# Patient Record
Sex: Male | Born: 2005 | Race: Black or African American | Hispanic: No | Marital: Single | Smoking: Never smoker
Health system: Southern US, Community
[De-identification: ages and names within clinical notes are randomized; demographics above are authoritative.]

---
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
## 2023-11-24 ENCOUNTER — Emergency Department (HOSPITAL_BASED_OUTPATIENT_CLINIC_OR_DEPARTMENT_OTHER)
Admission: EM | Admit: 2023-11-24 | Discharge: 2023-11-24 | Disposition: A | Discharge status: Home / Self Care | Payer: Medicaid Other | Attending: Emergency Medicine | Admitting: Emergency Medicine

## 2023-11-24 ENCOUNTER — Other Ambulatory Visit: Payer: Self-pay

## 2023-11-24 ENCOUNTER — Encounter (HOSPITAL_BASED_OUTPATIENT_CLINIC_OR_DEPARTMENT_OTHER): Payer: Self-pay

## 2023-11-24 DIAGNOSIS — R040 Epistaxis: Secondary | ICD-10-CM | POA: Insufficient documentation

## 2023-11-24 DIAGNOSIS — Z20822 Contact with and (suspected) exposure to covid-19: Secondary | ICD-10-CM | POA: Insufficient documentation

## 2023-11-24 DIAGNOSIS — J101 Influenza due to other identified influenza virus with other respiratory manifestations: Secondary | ICD-10-CM | POA: Diagnosis not present

## 2023-11-24 LAB — RESP PANEL BY RT-PCR (RSV, FLU A&B, COVID)  RVPGX2
Influenza A by PCR: POSITIVE — AB
Influenza B by PCR: NEGATIVE
Resp Syncytial Virus by PCR: NEGATIVE
SARS Coronavirus 2 by RT PCR: NEGATIVE

## 2023-11-24 MED ORDER — SILVER NITRATE-POT NITRATE 75-25 % EX MISC
1.0000 | Freq: Once | CUTANEOUS | Status: DC
  Filled 2023-11-24: qty 10

## 2023-11-24 MED ORDER — OXYMETAZOLINE HCL 0.05 % NA SOLN
1.0000 | Freq: Once | NASAL | Status: AC
  Administered 2023-11-24: 1 via NASAL
  Filled 2023-11-24: qty 30

## 2023-11-24 NOTE — ED Triage Notes (Signed)
Pt POV reporting nosebleed that started 30 min ago.

## 2023-11-24 NOTE — ED Provider Notes (Signed)
[place] [facility] AT [facility] Provider Note   CSN: [id number] Arrival date & time: 11/24/23  0027     History  Chief Complaint  Patient presents with   Epistaxis    Stanley Chan is a 18 y.o. male.  The history is provided by the patient.  Epistaxis Location:  L nare Severity:  Moderate Timing:  Constant Progression:  Unchanged Chronicity:  New Context: recent infection   Context: not nose picking   Relieved by:  Nothing Worsened by:  Nothing Ineffective treatments:  None tried Associated symptoms: no blood in oropharynx and no fever   Risk factors: no radiation treatment, no recent chemotherapy, no recent nasal surgery and no sinus problems        Home Medications Prior to Admission medications   Medication Sig Start Date End Date Taking? Authorizing Provider  albuterol (VENTOLIN HFA) 108 (90 Base) MCG/ACT inhaler Inhale 1-2 puffs into the lungs every 6 (six) hours as needed for wheezing or shortness of breath. 03/01/20   Lamptey, Britta Mccreedy, MD      Allergies    Penicillins    Review of Systems   Review of Systems  Constitutional:  Negative for fever.  HENT:  Positive for nosebleeds.   All other systems reviewed and are negative.   Physical Exam Updated Vital Signs BP (!) 148/76 (BP Location: Right Arm)   Pulse 83   Temp (!) 97.5 F (36.4 C)   Resp 18   Ht 5\' 7"  (1.702 m)   SpO2 98%  Physical Exam Vitals and nursing note reviewed.  Constitutional:      General: He is not in acute distress.    Appearance: Normal appearance. He is well-developed. He is not diaphoretic.  HENT:     Head: Normocephalic and atraumatic.     Nose: Nose normal.     Right Nostril: No septal hematoma.     Left Nostril: No septal hematoma.     Comments: Scabbed blood in the left anterior nare, no active bleeding  Eyes:     Conjunctiva/sclera: Conjunctivae normal.     Pupils: Pupils are equal, round, and reactive to light.  Cardiovascular:      Rate and Rhythm: Normal rate and regular rhythm.  Pulmonary:     Effort: Pulmonary effort is normal.     Breath sounds: Normal breath sounds. No wheezing or rales.  Abdominal:     General: Bowel sounds are normal.     Palpations: Abdomen is soft.     Tenderness: There is no abdominal tenderness. There is no guarding or rebound.  Musculoskeletal:        General: Normal range of motion.     Cervical back: Normal range of motion and neck supple.  Skin:    General: Skin is warm and dry.  Neurological:     Mental Status: He is alert and oriented to person, place, and time.     ED Results / Procedures / Treatments   Labs (all labs ordered are listed, but only abnormal results are displayed) Labs Reviewed - No data to display  EKG None  Radiology No results found.  Procedures Procedures    Medications Ordered in ED Medications  silver nitrate applicators applicator 1 Application (has no administration in time range)  oxymetazoline (AFRIN) 0.05 % nasal spray 1 spray (1 spray Each Nare Given 11/24/23 0043)    ED Course/ Medical Decision Making/ A&P  Medical Decision Making Patient with nose bleed for 30 minutes PTA  Amount and/or Complexity of Data Reviewed Independent Historian: parent    Details: See above   Risk OTC drugs. Prescription drug management. Risk Details: Well appearing, bleeding has stopped.  Nasal saline spray twice daily.  Stable for discharge.      Final Clinical Impression(s) / ED Diagnoses Final diagnoses:  Epistaxis  Return for intractable cough, coughing up blood, fevers > 100.4 unrelieved by medication, shortness of breath, intractable vomiting, chest pain, shortness of breath, weakness, numbness, changes in speech, facial asymmetry, abdominal pain, passing out, Inability to tolerate liquids or food, cough, altered mental status or any concerns. No signs of systemic illness or infection. The patient is  nontoxic-appearing on exam and vital signs are within normal limits.  I have reviewed the triage vital signs and the nursing notes. Pertinent labs & imaging results that were available during my care of the patient were reviewed by me and considered in my medical decision making (see chart for details). After history, exam, and medical workup I feel the patient has been appropriately medically screened and is safe for discharge home. Pertinent diagnoses were discussed with the patient. Patient was given return precautions.   Rx / DC Orders ED Discharge Orders     None         Zeba Luby, MD 11/24/23 1610
